# Patient Record
Sex: Female | Born: 1985 | Race: White | Hispanic: No | Marital: Single | Smoking: Current every day smoker
Health system: Southern US, Community
[De-identification: ages and names within clinical notes are randomized; demographics above are authoritative.]

## PROBLEM LIST (undated history)

## (undated) HISTORY — PX: TONSILLECTOMY: SUR1361

---

## 2020-06-22 ENCOUNTER — Emergency Department (INDEPENDENT_AMBULATORY_CARE_PROVIDER_SITE_OTHER): Payer: Medicaid Other

## 2020-06-22 ENCOUNTER — Emergency Department (INDEPENDENT_AMBULATORY_CARE_PROVIDER_SITE_OTHER)
Admission: EM | Admit: 2020-06-22 | Discharge: 2020-06-22 | Disposition: A | Discharge status: Home / Self Care | Payer: Medicaid Other | Source: Home / Self Care

## 2020-06-22 ENCOUNTER — Other Ambulatory Visit: Payer: Self-pay

## 2020-06-22 DIAGNOSIS — R350 Frequency of micturition: Secondary | ICD-10-CM | POA: Diagnosis not present

## 2020-06-22 DIAGNOSIS — R109 Unspecified abdominal pain: Secondary | ICD-10-CM

## 2020-06-22 DIAGNOSIS — N200 Calculus of kidney: Secondary | ICD-10-CM

## 2020-06-22 DIAGNOSIS — R300 Dysuria: Secondary | ICD-10-CM

## 2020-06-22 DIAGNOSIS — R1084 Generalized abdominal pain: Secondary | ICD-10-CM

## 2020-06-22 LAB — POCT CBC W AUTO DIFF (K'VILLE URGENT CARE)

## 2020-06-22 LAB — COMPLETE METABOLIC PANEL WITH GFR
AG Ratio: 1.8 (calc) (ref 1.0–2.5)
ALT: 15 U/L (ref 6–29)
AST: 17 U/L (ref 10–30)
Albumin: 4.5 g/dL (ref 3.6–5.1)
Alkaline phosphatase (APISO): 39 U/L (ref 31–125)
BUN: 13 mg/dL (ref 7–25)
CO2: 26 mmol/L (ref 20–32)
Calcium: 9.5 mg/dL (ref 8.6–10.2)
Chloride: 104 mmol/L (ref 98–110)
Creat: 0.74 mg/dL (ref 0.50–1.10)
GFR, Est African American: 123 mL/min/{1.73_m2} (ref >=60)
GFR, Est Non African American: 106 mL/min/{1.73_m2} (ref >=60)
Globulin: 2.5 g/dL (calc) (ref 1.9–3.7)
Glucose, Bld: 91 mg/dL (ref 65–99)
Potassium: 3.9 mmol/L (ref 3.5–5.3)
Sodium: 137 mmol/L (ref 135–146)
Total Bilirubin: 0.7 mg/dL (ref 0.2–1.2)
Total Protein: 7 g/dL (ref 6.1–8.1)

## 2020-06-22 LAB — POCT URINALYSIS DIP (MANUAL ENTRY)
Bilirubin, UA: NEGATIVE
Blood, UA: NEGATIVE
Glucose, UA: NEGATIVE mg/dL
Ketones, POC UA: NEGATIVE mg/dL
Leukocytes, UA: NEGATIVE
Nitrite, UA: NEGATIVE
Protein Ur, POC: NEGATIVE mg/dL
Spec Grav, UA: >=1.03 — AB (ref 1.010–1.025)
Urobilinogen, UA: 0.2 E.U./dL
pH, UA: 5.5 (ref 5.0–8.0)

## 2020-06-22 MED ORDER — CYCLOBENZAPRINE HCL 5 MG PO TABS: 5.0000 mg | ORAL_TABLET | Freq: Two times a day (BID) | ORAL | 0 refills | Status: AC | PRN

## 2020-06-22 MED ORDER — NAPROXEN 500 MG PO TABS: 500.0000 mg | ORAL_TABLET | Freq: Two times a day (BID) | ORAL | 0 refills | Status: AC

## 2020-06-22 MED ORDER — KETOROLAC TROMETHAMINE 60 MG/2ML IM SOLN
60.0000 mg | Freq: Once | INTRAMUSCULAR | Status: AC
  Administered 2020-06-22: 60 mg via INTRAMUSCULAR

## 2020-06-22 MED ORDER — ONDANSETRON 4 MG PO TBDP
4.0000 mg | ORAL_TABLET | Freq: Once | ORAL | Status: AC
  Administered 2020-06-22: 4 mg via ORAL

## 2020-06-22 NOTE — ED Triage Notes (Signed)
Patient reports 2-3 weeks dysuria, increased frequency, urgency. 1 week low back pain, worse on left side. Reports nausea. Denies fever, chills, emesis. Hx previous kidney infection, uti's.

## 2020-06-22 NOTE — Discharge Instructions (Addendum)
  You may take 500mg  acetaminophen every 4-6 hours or in combination with the prescribed naproxen.  Flexeril (cyclobenzaprine) is a muscle relaxer and may cause drowsiness. Do not drink alcohol, drive, or operate heavy machinery while taking.  Call to schedule a follow up appointment with your primary care provider and/or a urologist if not improving by next week.  Call 911 or go to the hospital if symptoms worsening- worsening pain, unable to keep down fluids, dizziness/passing out, or other new concerning symptoms develop.

## 2020-06-22 NOTE — ED Provider Notes (Signed)
[facility]    CSN: [id number] Arrival date & time: 06/22/20  1023      History   Chief Complaint Chief Complaint  Patient presents with  . Back Pain  . Dysuria    HPI Tiffany Mathis is a 34 y.o. female.   HPI  Tiffany Mathis is a 34 y.o. female presenting to UC with c/o 2-3 weeks of dysuria, increased frequency and urgency. One week of low back pain, worse on the Left side.  Associated mild nausea.  Denies fever, chills, vomiting.  Hx of prior kidney infections and UTIs. Symptoms feel similar.  She has taken OTC urinary medication w/o relief.  No personal hx of kidney stones but mother has had kidney stones.  Hx of low back pain but this back pain does not feel the same. Pain is cramping "like menstrual cramps."  LMP: 06/07/20  History reviewed. No pertinent past medical history.  There are no problems to display for this patient.   Past Surgical History:  Procedure Laterality Date  . CESAREAN SECTION    . TONSILLECTOMY      OB History   No obstetric history on file.      Home Medications    Prior to Admission medications   Medication Sig Start Date End Date Taking? Authorizing Provider  cyclobenzaprine (FLEXERIL) 5 MG tablet Take 1-2 tablets (5-10 mg total) by mouth 2 (two) times daily as needed for muscle spasms. 06/22/20   Lurene Shadow, PA-C  naproxen (NAPROSYN) 500 MG tablet Take 1 tablet (500 mg total) by mouth 2 (two) times daily. 06/22/20   Lurene Shadow, PA-C    Family History Family History  Problem Relation Age of Onset  . Hypertension Mother     Social History Social History   Tobacco Use  . Smoking status: Current Every Day Smoker    Types: E-cigarettes  . Smokeless tobacco: Never Used  Substance Use Topics  . Alcohol use: Not on file  . Drug use: Not on file     Allergies   Sulfa antibiotics   Review of Systems Review of Systems  Constitutional: Negative for chills and fever.  Gastrointestinal: Positive for abdominal  pain (mild, generalized, worse on Left side) and nausea. Negative for diarrhea and vomiting.  Genitourinary: Positive for dysuria, flank pain (left), frequency and urgency. Negative for hematuria, pelvic pain, vaginal bleeding, vaginal discharge and vaginal pain.  Musculoskeletal: Positive for back pain.     Physical Exam Triage Vital Signs ED Triage Vitals  Enc Vitals Group     BP 06/22/20 1057 134/83     Pulse Rate 06/22/20 1057 71     Resp 06/22/20 1057 12     Temp 06/22/20 1057 98.3 F (36.8 C)     Temp Source 06/22/20 1057 Oral     SpO2 06/22/20 1057 100 %     Weight --      Height --      Head Circumference --      Peak Flow --      Pain Score 06/22/20 1054 6     Pain Loc --      Pain Edu? --      Excl. in GC? --    No data found.  Updated Vital Signs BP 134/83 (BP Location: Right Arm)   Pulse 71   Temp 98.3 F (36.8 C) (Oral)   Resp 12   LMP 06/07/2020   SpO2 100%   Visual Acuity Right Eye Distance:  Left Eye Distance:   Bilateral Distance:    Right Eye Near:   Left Eye Near:    Bilateral Near:     Physical Exam Vitals and nursing note reviewed.  Constitutional:      Appearance: Normal appearance. She is well-developed.     Comments: Sitting on exam bed, holding her Left lower back, appears uncomfortable but alert and cooperative during exam.  HENT:     Head: Normocephalic and atraumatic.  Cardiovascular:     Rate and Rhythm: Normal rate and regular rhythm.  Pulmonary:     Effort: Pulmonary effort is normal. No respiratory distress.     Breath sounds: Normal breath sounds. No stridor. No wheezing, rhonchi or rales.  Abdominal:     General: There is no distension.     Palpations: Abdomen is soft. There is no mass.     Tenderness: There is abdominal tenderness. There is left CVA tenderness. There is no right CVA tenderness, guarding or rebound.     Hernia: No hernia is present.     Comments: Diffuse mild tenderness, no rebound or guarding    Musculoskeletal:        General: Tenderness present. Normal range of motion.     Cervical back: Normal range of motion.       Back:     Comments: Tenderness to Left mid to lower muscles. No spinal tenderness. Full ROM upper and lower extremities, 5/5 strength. Negative straight leg raise.   Skin:    General: Skin is warm and dry.  Neurological:     Mental Status: She is alert and oriented to person, place, and time.  Psychiatric:        Behavior: Behavior normal.      UC Treatments / Results  Labs (all labs ordered are listed, but only abnormal results are displayed) Labs Reviewed  POCT URINALYSIS DIP (MANUAL ENTRY) - Abnormal; Notable for the following components:      Result Value   Spec Grav, UA >=1.030 (*)    All other components within normal limits  URINE CULTURE  COMPLETE METABOLIC PANEL WITH GFR  POCT CBC W AUTO DIFF ([facility])    EKG   Radiology CT Renal Stone Study  Result Date: 06/22/2020 CLINICAL DATA:  Left flank pain for 3 weeks. EXAM: CT ABDOMEN AND PELVIS WITHOUT CONTRAST TECHNIQUE: Multidetector CT imaging of the abdomen and pelvis was performed following the standard protocol without IV contrast. COMPARISON:  None. FINDINGS: Lower chest: The lung bases are clear of acute process. No pleural effusion or pulmonary lesions. The heart is normal in size. No pericardial effusion. The distal esophagus and aorta are unremarkable. Hepatobiliary: No focal hepatic lesions or intrahepatic biliary dilatation. The gallbladder is normal. No common bile duct dilatation. Pancreas: No mass, inflammation or ductal dilatation. Spleen: Normal size. No focal lesions. Adrenals/Urinary Tract: Adrenal glands are normal. Small lower pole renal calculi bilaterally. No obstructing ureteral calculi or bladder calculi. No hydroureteronephrosis. No worrisome renal or bladder lesions are identified without contrast. Stomach/Bowel: The stomach, duodenum, small bowel and colon are  grossly normal without oral contrast. No inflammatory changes, mass lesions or obstructive findings. The appendix is normal. Vascular/Lymphatic: The aorta is normal in caliber. No atheroscerlotic calcifications. No mesenteric of retroperitoneal mass or adenopathy. Small scattered lymph nodes are noted. Reproductive: The uterus and ovaries are unremarkable. Other: No pelvic mass or adenopathy. No free pelvic fluid collections. No inguinal mass or adenopathy. Small periumbilical abdominal wall hernia containing fat. Musculoskeletal:  No significant bony findings. IMPRESSION: 1. Small lower pole renal calculi bilaterally but no obstructing ureteral calculi or bladder calculi. 2. No acute abdominal/pelvic findings, mass lesions or adenopathy. Electronically Signed   By: Rudie Meyer M.D.   On: 06/22/2020 12:19    Procedures Procedures (including critical care time)  Medications Ordered in UC Medications  ketorolac (TORADOL) injection 60 mg (60 mg Intramuscular Given 06/22/20 1135)  ondansetron (ZOFRAN-ODT) disintegrating tablet 4 mg (4 mg Oral Given 06/22/20 1135)    Initial Impression / Assessment and Plan / UC Course  I have reviewed the triage vital signs and the nursing notes.  Pertinent labs & imaging results that were available during my care of the patient were reviewed by me and considered in my medical decision making (see chart for details).    Left lower back and diffuse abdominal tenderness noted on exam Pt appears well, non-toxic, afebrile. CBC: unremarkable UA: unremarkable Discussed labs and imaging with pt, questing if pt already passed small stones CMP pending Encouraged conservative tx with naproxen, due to muscular tenderness on exam, will have pt try flexeril as well F/u PCP and urology as needed Discussed symptoms that warrant emergent care in the ED. AVS given  Final Clinical Impressions(s) / UC Diagnoses   Final diagnoses:  Left flank pain  Dysuria  Generalized  abdominal pain  Urinary frequency  Renal calculi     Discharge Instructions      You may take 500mg  acetaminophen every 4-6 hours or in combination with the prescribed naproxen.  Flexeril (cyclobenzaprine) is a muscle relaxer and may cause drowsiness. Do not drink alcohol, drive, or operate heavy machinery while taking.  Call to schedule a follow up appointment with your primary care provider and/or a urologist if not improving by next week.  Call 911 or go to the hospital if symptoms worsening- worsening pain, unable to keep down fluids, dizziness/passing out, or other new concerning symptoms develop.     ED Prescriptions    Medication Sig Dispense Auth. Provider   naproxen (NAPROSYN) 500 MG tablet Take 1 tablet (500 mg total) by mouth 2 (two) times daily. 30 tablet , Laveta Gilkey O, PA-C   cyclobenzaprine (FLEXERIL) 5 MG tablet Take 1-2 tablets (5-10 mg total) by mouth 2 (two) times daily as needed for muscle spasms. 30 tablet Doroteo Glassman, Lurene Shadow     I have reviewed the PDMP during this encounter.   [place], Lurene Shadow 06/22/20 1307

## 2020-06-23 LAB — URINE CULTURE
MICRO NUMBER:: 10662941
Result:: NO GROWTH
SPECIMEN QUALITY:: ADEQUATE

## 2020-11-23 IMAGING — CT CT RENAL STONE PROTOCOL
2 of 4 series · 16 of 46 positions shown, 18 images · non-contrast
Comparison: None.

CLINICAL DATA: Left flank pain for 3 weeks.

EXAM:
CT ABDOMEN AND PELVIS WITHOUT CONTRAST
TECHNIQUE: Multidetector CT imaging of the abdomen and pelvis was performed
following the standard protocol without IV contrast.

[Series 2: axial st · axial · 0.66mm/px · z∈[-462,-67]mm · 13 of 87 slices shown, 15 images]
[im 4/87  soft-tissue]
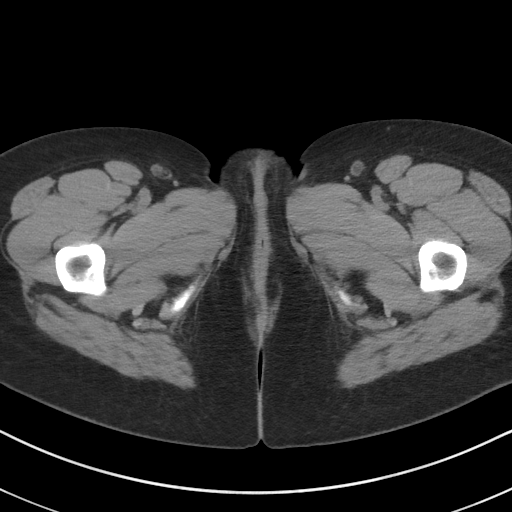
[im 4/87  bone]
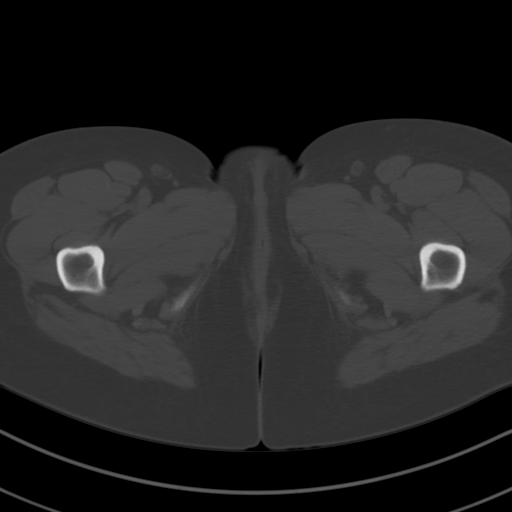
[im 11/87  soft-tissue]
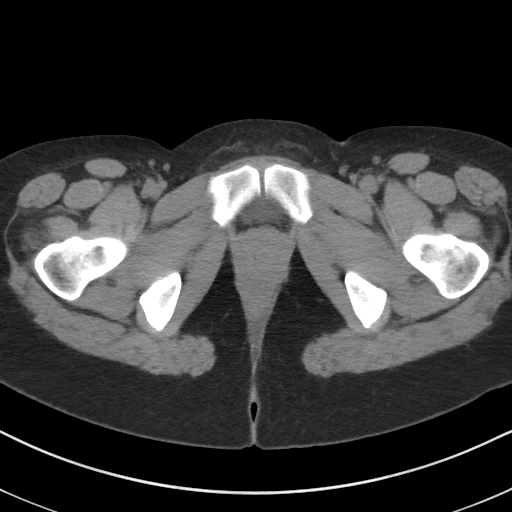
[im 18/87  soft-tissue]
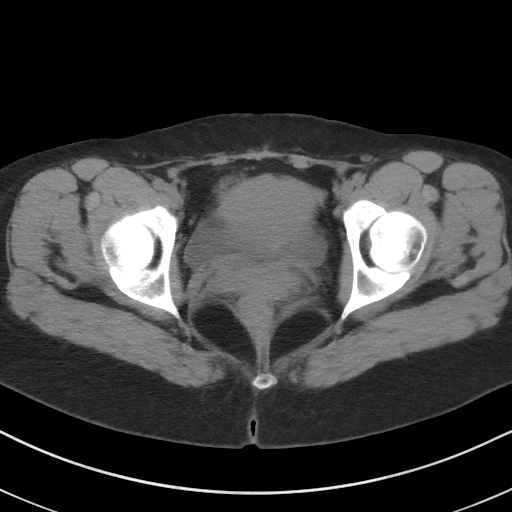
[im 25/87  soft-tissue]
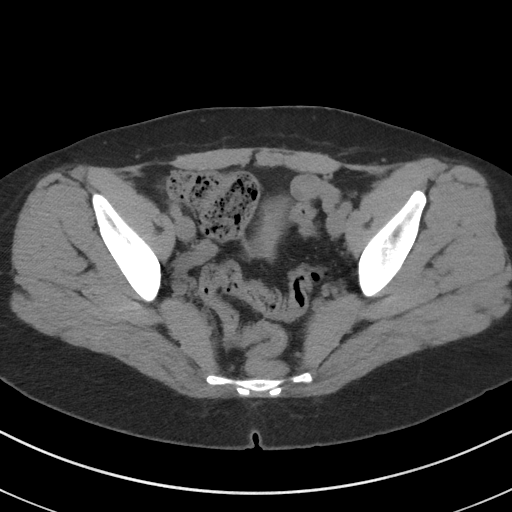
[im 31/87  soft-tissue]
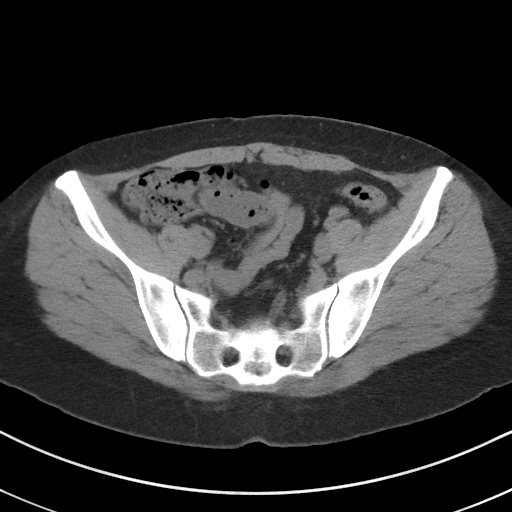
[im 38/87  soft-tissue]
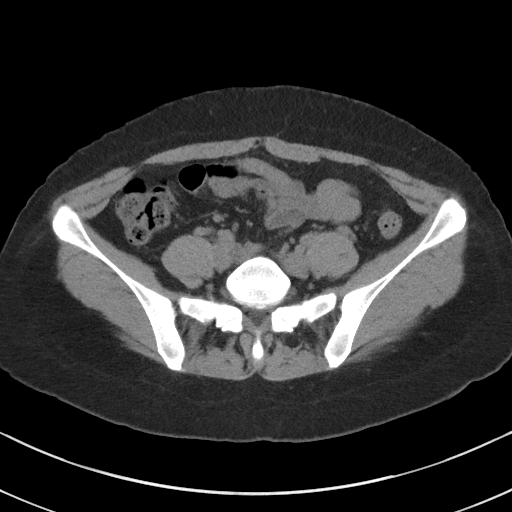
[im 45/87  soft-tissue]
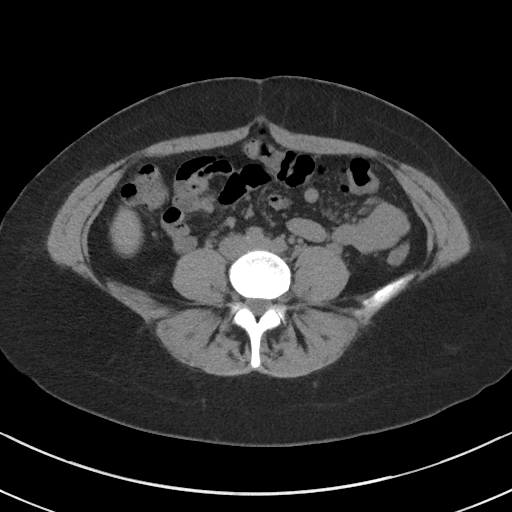
[im 49/87  soft-tissue]
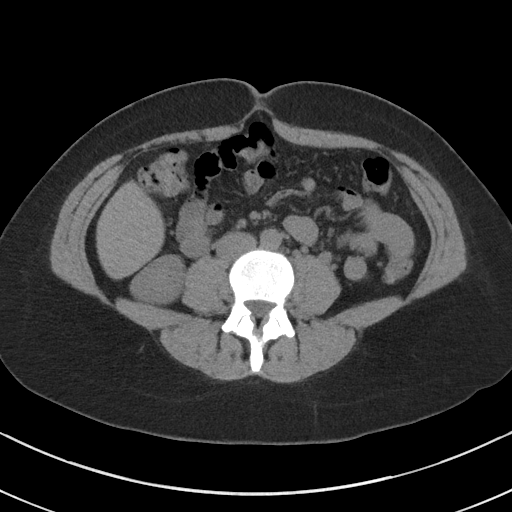
[im 56/87  soft-tissue]
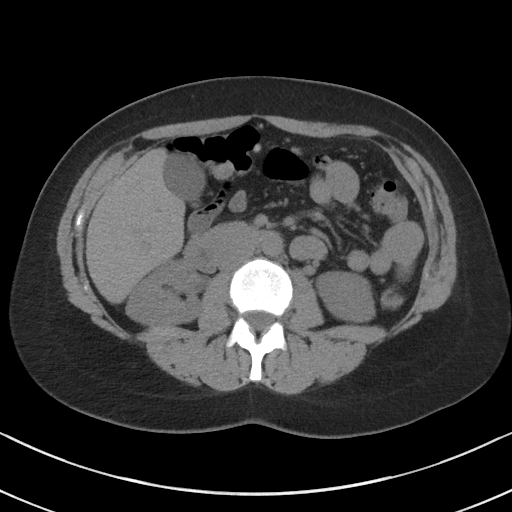
[im 56/87  bone]
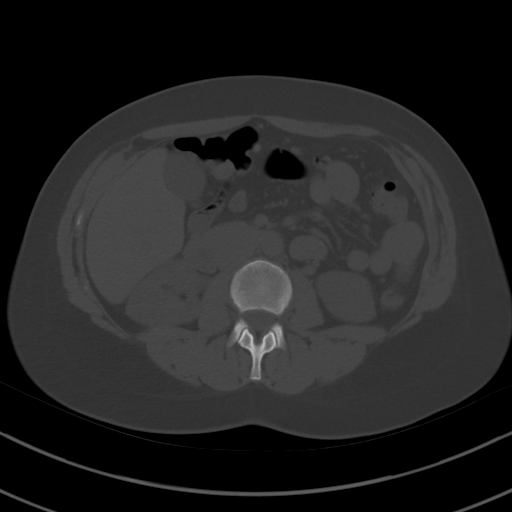
[im 62/87  soft-tissue]
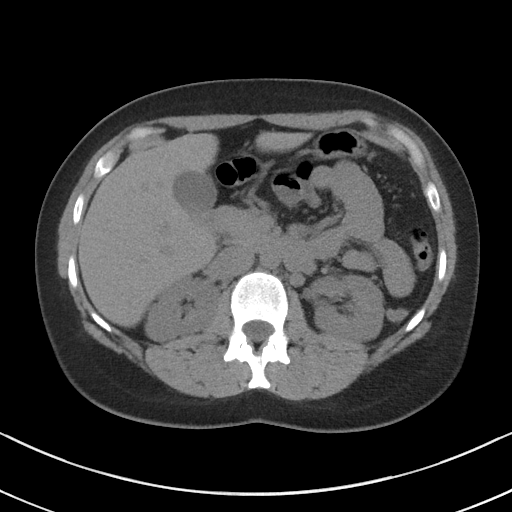
[im 69/87  soft-tissue]
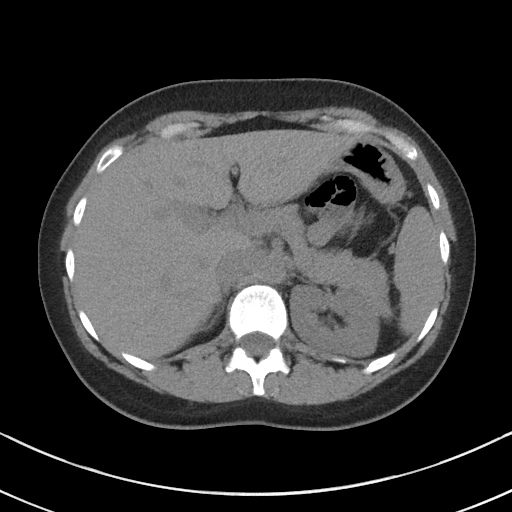
[im 76/87  soft-tissue]
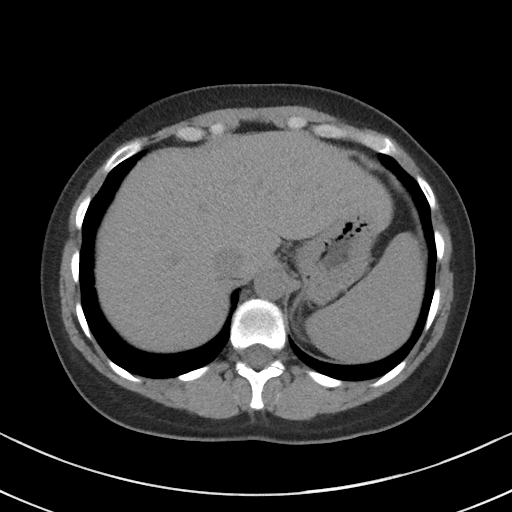
[im 83/87  soft-tissue]
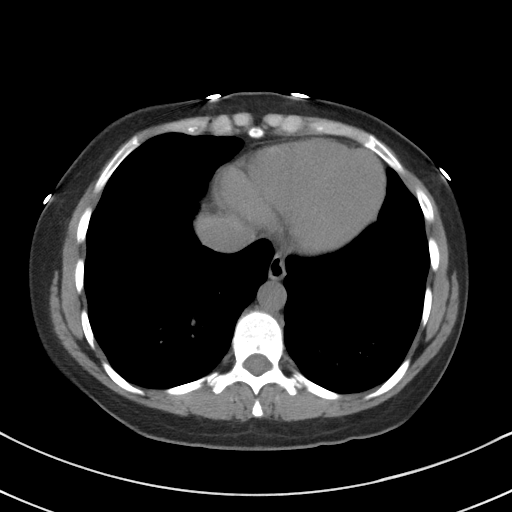

[Series 5: coronal st · coronal · 0.77mm/px · 3 of 84 slices shown]
[im 28/84  soft-tissue]
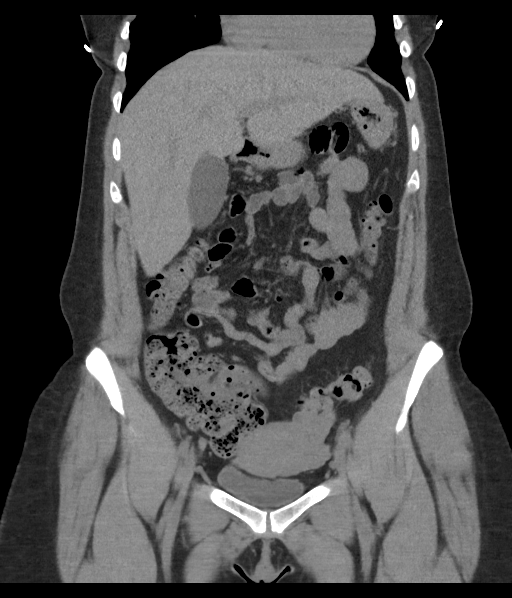
[im 37/84  soft-tissue]
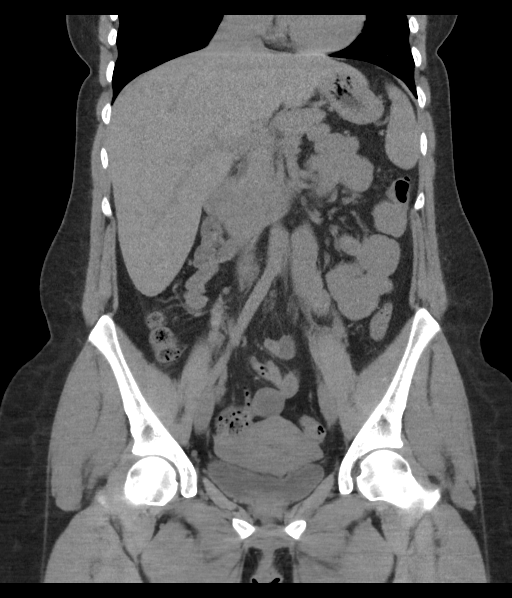
[im 47/84  soft-tissue]
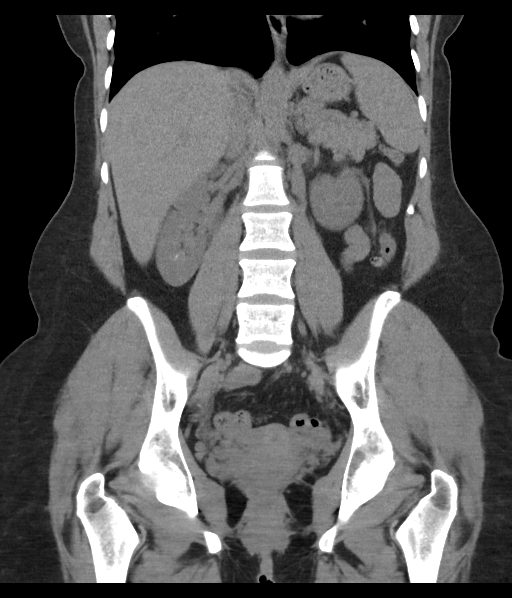

[16 of 46 positions shown; findings below may reference images not displayed]

FINDINGS: Lower chest: The lung bases are clear of acute process. No pleural
effusion or pulmonary lesions. The heart is normal in size. No
pericardial effusion. The distal esophagus and aorta are
unremarkable.

Hepatobiliary: No focal hepatic lesions or intrahepatic biliary
dilatation. The gallbladder is normal. No common bile duct
dilatation.

Pancreas: No mass, inflammation or ductal dilatation.

Spleen: Normal size. No focal lesions.

Adrenals/Urinary Tract: Adrenal glands are normal.

Small lower pole renal calculi bilaterally. No obstructing ureteral
calculi or bladder calculi. No hydroureteronephrosis. No worrisome
renal or bladder lesions are identified without contrast.

Stomach/Bowel: The stomach, duodenum, small bowel and colon are
grossly normal without oral contrast. No inflammatory changes, mass
lesions or obstructive findings. The appendix is normal.

Vascular/Lymphatic: The aorta is normal in caliber. No
atheroscerlotic calcifications. No mesenteric of retroperitoneal
mass or adenopathy. Small scattered lymph nodes are noted.

Reproductive: The uterus and ovaries are unremarkable.

Other: No pelvic mass or adenopathy. No free pelvic fluid
collections. No inguinal mass or adenopathy. Small periumbilical
abdominal wall hernia containing fat.

Musculoskeletal: No significant bony findings.
IMPRESSION: 1. Small lower pole renal calculi bilaterally but no obstructing
ureteral calculi or bladder calculi.
2. No acute abdominal/pelvic findings, mass lesions or adenopathy.
